# Patient Record
Sex: Female | Born: 1990 | Race: White | Hispanic: No | Marital: Single | Smoking: Never smoker
Health system: Southern US, Community
[De-identification: ages and names within clinical notes are randomized; demographics above are authoritative.]

## PROBLEM LIST (undated history)

## (undated) DIAGNOSIS — N898 Other specified noninflammatory disorders of vagina: Secondary | ICD-10-CM

## (undated) HISTORY — DX: Other specified noninflammatory disorders of vagina: N89.8

---
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
## 2009-09-15 ENCOUNTER — Ambulatory Visit: Payer: Self-pay | Admitting: Unknown Physician Specialty

## 2009-10-03 ENCOUNTER — Ambulatory Visit: Payer: Self-pay | Admitting: Surgery

## 2009-10-06 ENCOUNTER — Ambulatory Visit: Payer: Self-pay | Admitting: Surgery

## 2010-09-23 IMAGING — US ABDOMEN ULTRASOUND
1 series · 17 of 25 positions shown · non-contrast
Comparison: none

REASON FOR EXAM: epigastric abd pain
COMMENTS:

[Series 1: abdomen ultrasound · 17 of 73 slices shown]
[im 1/73]
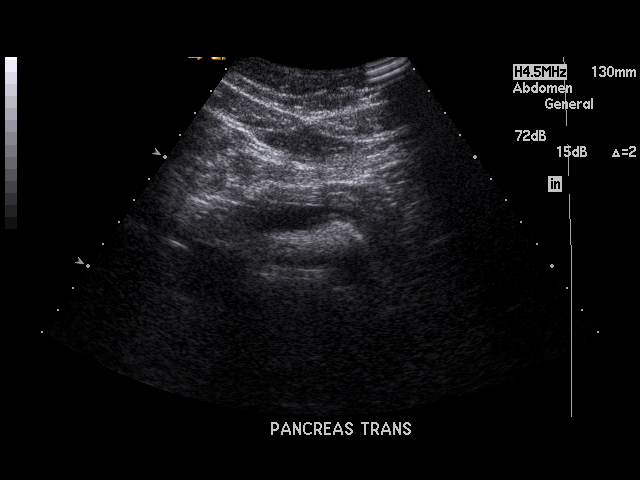
[im 7/73]
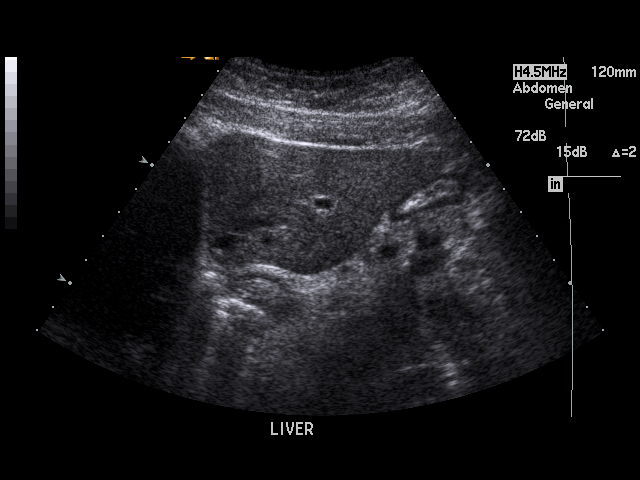
[im 10/73]
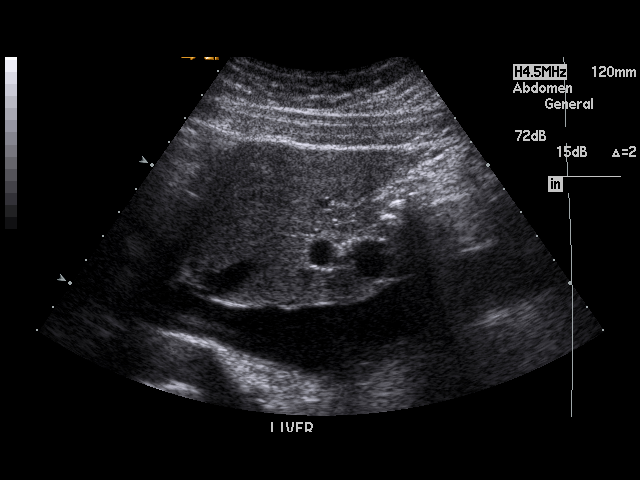
[im 16/73]
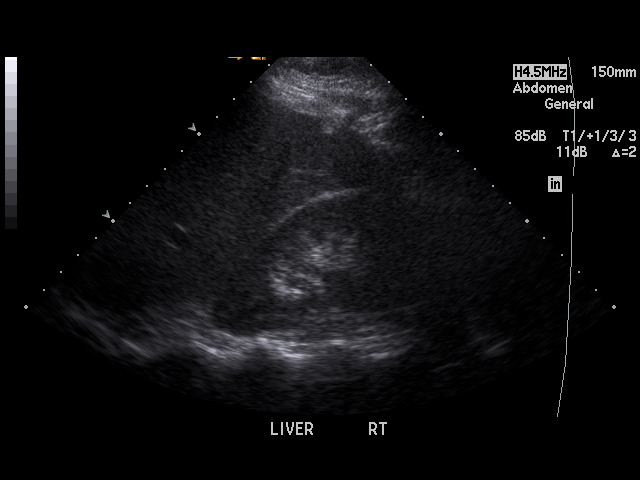
[im 19/73]
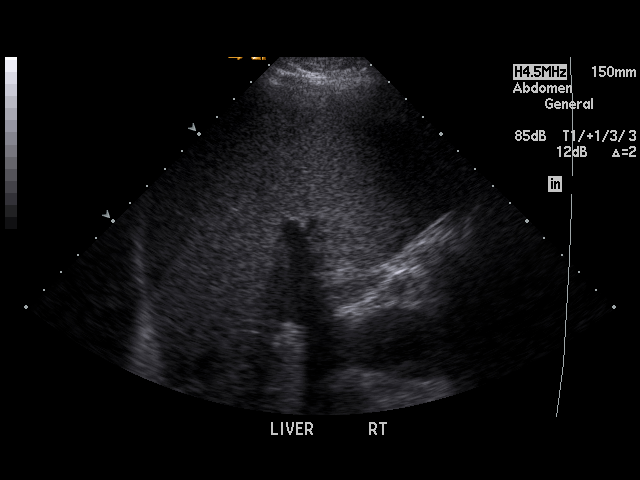
[im 25/73]
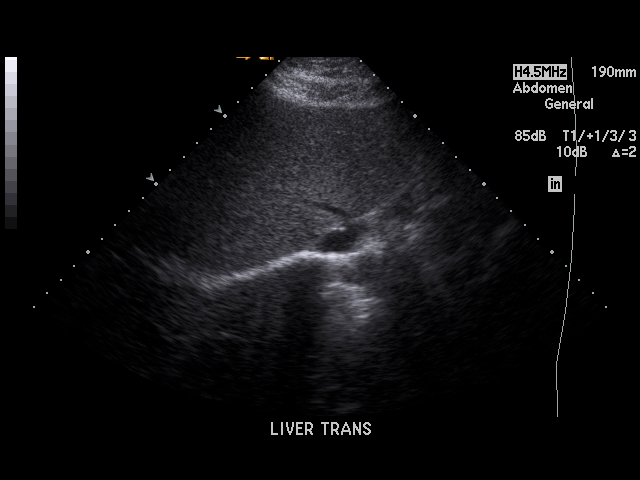
[im 28/73]
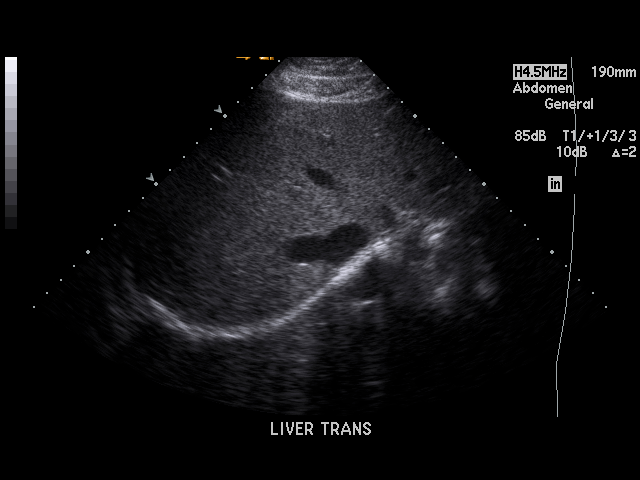
[im 34/73]
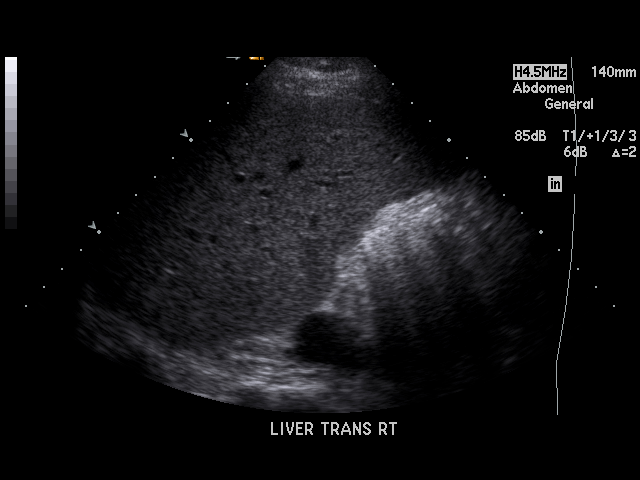
[im 37/73]
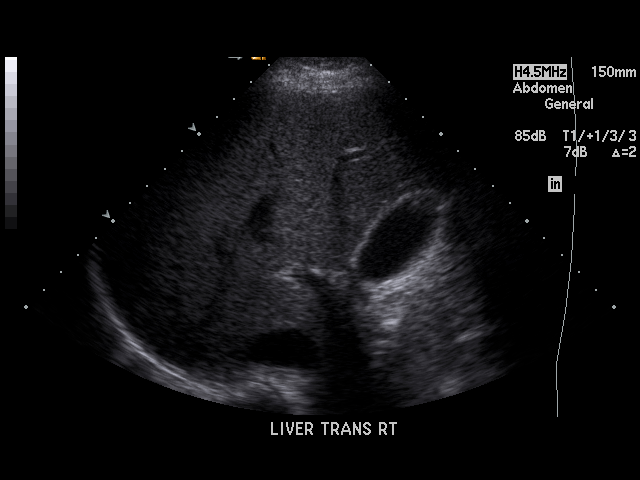
[im 40/73]
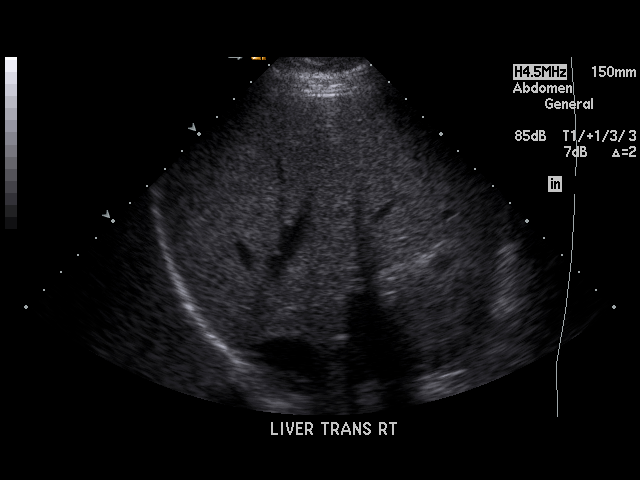
[im 46/73]
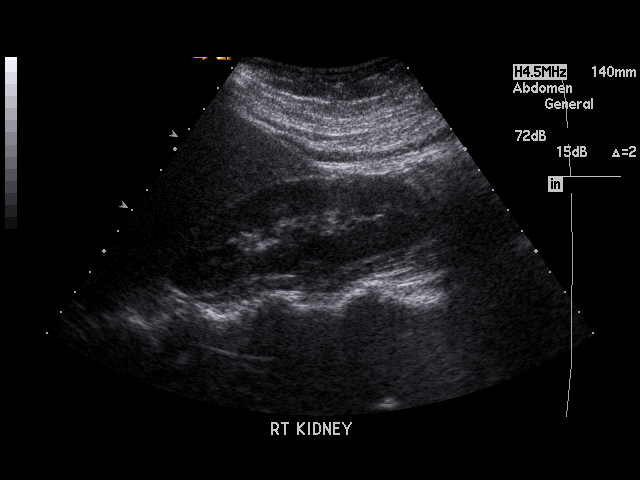
[im 49/73]
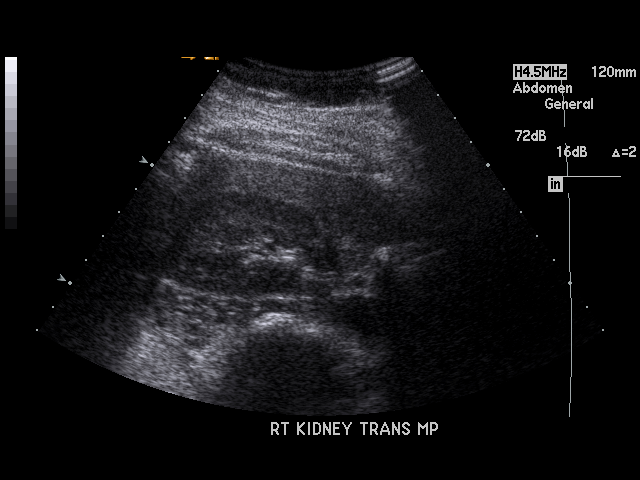
[im 55/73]
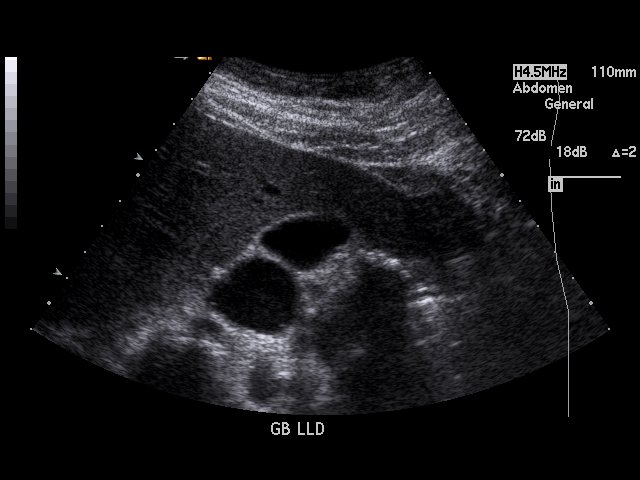
[im 58/73]
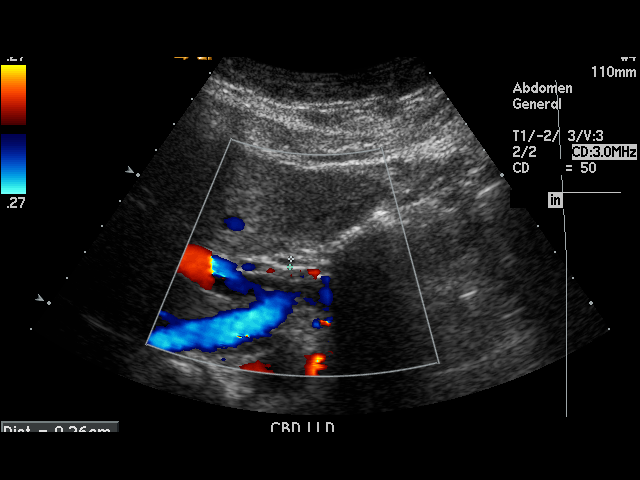
[im 64/73]
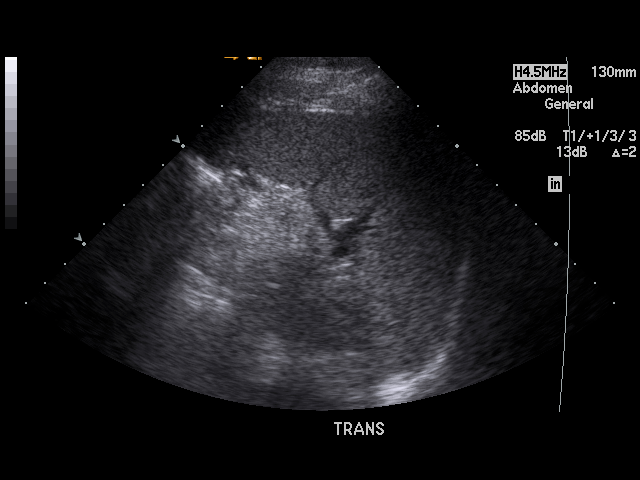
[im 67/73]
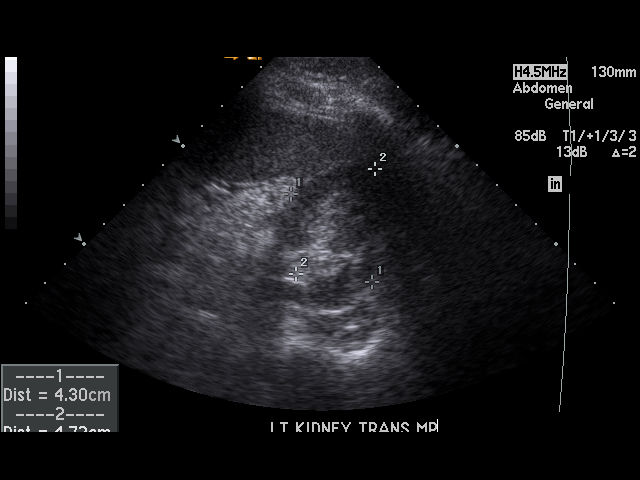
[im 73/73]
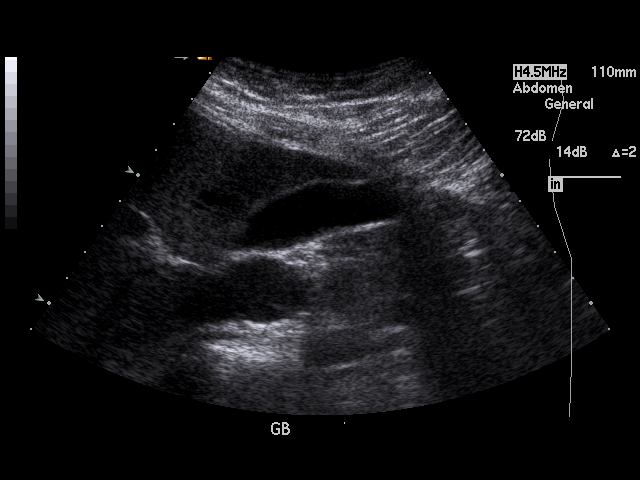

[17 of 25 positions shown; findings below may reference images not displayed]

PROCEDURE:     US  - US ABDOMEN GENERAL SURVEY  - September 15, 2009  [DATE]

RESULT:     The hepatic echo pattern is normal in appearance. No gallstones
are seen. There is no thickening of the gallbladder wall. The common bile
duct measures 2.6 mm in diameter which is within normal limits. The spleen
is prominent but is within the limits of normal for size. The spleen
measures 12.59 cm in AP diameter. The pancreas, abdominal aorta and inferior
vena cava show no significant abnormalities. The kidneys show no
hydronephrosis. There is no ascites.
IMPRESSION: 1. No gallstones or other acute change is identified.
2. The spleen is prominent but within the limits of normal for size.

## 2012-07-23 HISTORY — PX: CERVICAL CONE BIOPSY: SUR198

## 2014-04-13 LAB — LIPID PANEL
Cholesterol: 169 mg/dL (ref 0–200)
HDL: 72 mg/dL — AB (ref 35–70)
LDL CALC: 88 mg/dL
Triglycerides: 44 mg/dL (ref 40–160)

## 2014-04-13 LAB — CBC AND DIFFERENTIAL: Hemoglobin: 14.5 g/dL (ref 12.0–16.0)

## 2014-04-13 LAB — TSH: TSH: 0.8 u[IU]/mL (ref 0.41–5.90)

## 2014-04-13 LAB — BASIC METABOLIC PANEL
BUN: 7 mg/dL (ref 4–21)
Creatinine: 0.9 mg/dL (ref 0.5–1.1)

## 2014-07-02 LAB — HM PAP SMEAR

## 2014-07-14 ENCOUNTER — Ambulatory Visit: Payer: Self-pay | Admitting: Physician Assistant

## 2014-07-14 LAB — RAPID STREP-A WITH REFLX: MICRO TEXT REPORT: NEGATIVE

## 2014-07-17 LAB — BETA STREP CULTURE(ARMC)

## 2014-11-16 DIAGNOSIS — IMO0002 Reserved for concepts with insufficient information to code with codable children: Secondary | ICD-10-CM

## 2014-11-16 DIAGNOSIS — B9689 Other specified bacterial agents as the cause of diseases classified elsewhere: Secondary | ICD-10-CM

## 2014-11-16 DIAGNOSIS — N926 Irregular menstruation, unspecified: Secondary | ICD-10-CM

## 2014-11-16 DIAGNOSIS — N760 Acute vaginitis: Secondary | ICD-10-CM

## 2014-11-16 DIAGNOSIS — A630 Anogenital (venereal) warts: Secondary | ICD-10-CM | POA: Insufficient documentation

## 2015-01-06 ENCOUNTER — Encounter: Payer: Self-pay | Admitting: Obstetrics and Gynecology

## 2015-01-07 ENCOUNTER — Encounter: Payer: Self-pay | Admitting: Obstetrics and Gynecology

## 2015-03-03 ENCOUNTER — Encounter: Payer: Self-pay | Admitting: Obstetrics and Gynecology

## 2015-03-03 ENCOUNTER — Other Ambulatory Visit: Payer: Self-pay | Admitting: Obstetrics and Gynecology

## 2015-03-03 ENCOUNTER — Ambulatory Visit (INDEPENDENT_AMBULATORY_CARE_PROVIDER_SITE_OTHER): Payer: BLUE CROSS/BLUE SHIELD | Admitting: Obstetrics and Gynecology

## 2015-03-03 VITALS — BP 125/82 | HR 90 | Ht 68.0 in | Wt 205.0 lb

## 2015-03-03 DIAGNOSIS — Z01419 Encounter for gynecological examination (general) (routine) without abnormal findings: Secondary | ICD-10-CM | POA: Diagnosis not present

## 2015-03-03 MED ORDER — PODOFILOX 0.5 % EX GEL: Freq: Every evening | CUTANEOUS | Status: DC | PRN

## 2015-03-03 NOTE — Progress Notes (Signed)
  Subjective:     Jill Gordon is a 24 y.o. female and is here for a comprehensive physical exam. The patient reports vulvar itching and possible warts returned.  Social History   Social History  . Marital Status: Single    Spouse Name: N/A  . Number of Children: N/A  . Years of Education: N/A   Occupational History  . Not on file.   Social History Main Topics  . Smoking status: Never Smoker   . Smokeless tobacco: Not on file  . Alcohol Use: Yes  . Drug Use: No  . Sexual Activity: Yes    Birth Control/ Protection: Condom   Other Topics Concern  . Not on file   Social History Narrative   Health Maintenance  Topic Date Due  . HIV Screening  02/11/2005  . PAP SMEAR  02/12/2008  . TETANUS/TDAP  02/11/2009  . INFLUENZA VACCINE  02/21/2015    The following portions of the patient's history were reviewed and updated as appropriate: allergies, current medications, past family history, past medical history, past social history, past surgical history and problem list.  Review of Systems A comprehensive review of systems was negative except for: Genitourinary: positive for genital lesions and clitoral itching   Objective:    General appearance: alert, cooperative, appears stated age and mildly obese Neck: no adenopathy, no carotid bruit, no JVD, supple, symmetrical, trachea midline and thyroid not enlarged, symmetric, no tenderness/mass/nodules Lungs: clear to auscultation bilaterally Breasts: normal appearance, no masses or tenderness Heart: regular rate and rhythm, S1, S2 normal, no murmur, click, rub or gallop Abdomen: soft, non-tender; bowel sounds normal; no masses,  no organomegaly Pelvic: cervix normal in appearance, no adnexal masses or tenderness, no cervical motion tenderness, positive findings: 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 Genital warts and HPV changes; Obesity; H/O abnormal pap      Plan:  Pap obtained; rx for condylox gel- apply qohs and wash off in am, weight loss recommendation made.  RTC 1 year or PRN   See After Visit Summary for Counseling Recommendations

## 2015-03-03 NOTE — Patient Instructions (Addendum)
Place annual gynecologic exam patient instructions here.  Thank you for enrolling in MyChart. Please follow the instructions below to securely access your online medical record. MyChart allows you to send messages to your doctor, view your test results, manage appointments, and more.   How Do I Sign Up? 1. In your Internet browser, go to [employer] and enter [email or web address]. 2. Click on the Sign Up Now link in the Sign In box. You will see the New Member Sign Up page. 3. Enter your MyChart Access Code exactly as it appears below. You will not need to use this code after you've completed the sign-up process. If you do not sign up before the expiration date, you must request a new code.  MyChart Access Code: [email or web address] Expires: 05/02/2015  3:42 PM  4. Enter your Social Security Number ([id number]) and Date of Birth (mm/dd/yyyy) as indicated and click Submit. You will be taken to the next sign-up page. 5. Create a MyChart ID. This will be your MyChart login ID and cannot be changed, so think of one that is secure and easy to remember. 6. Create a MyChart password. You can change your password at any time. 7. Enter your Password Reset Question and Answer. This can be used at a later time if you forget your password.  8. Enter your e-mail address. You will receive e-mail notification when new information is available in MyChart. 9. Click Sign Up. You can now view your medical record.   Additional Information Remember, MyChart is NOT to be used for urgent needs. For medical emergencies, dial 911.   Genital Warts Genital warts are a sexually transmitted infection. They may appear as small bumps on the tissues of the genital area. CAUSES  Genital warts are caused by a virus called human papillomavirus (HPV). HPV is the most common sexually transmitted disease (STD) and infection of the sex organs. This infection is spread by having unprotected sex with an infected  person. It can be spread by vaginal, anal, and oral sex. Many people do not know they are infected. They may be infected for years without problems. However, even if they do not have problems, they can unknowingly pass the infection to their sexual partners. SYMPTOMS   Itching and irritation in the genital area.  Warts that bleed.  Painful sexual intercourse. DIAGNOSIS  Warts are usually recognized with the naked eye on the vagina, vulva, perineum, anus, and rectum. Certain tests can also diagnose genital warts, such as:  A Pap test.  A tissue sample (biopsy) exam.  Colposcopy. A magnifying tool is used to examine the vagina and cervix. The HPV cells will change color when certain solutions are used. TREATMENT  Warts can be removed by:  Applying certain chemicals, such as cantharidin or podophyllin.  Liquid nitrogen freezing (cryotherapy).  Immunotherapy with Candida or Trichophyton injections.  Laser treatment.  Burning with an electrified probe (electrocautery).  Interferon injections.  Surgery. PREVENTION  HPV vaccination can help prevent HPV infections that cause genital warts and that cause cancer of the cervix. It is recommended that the vaccination be given to people between the ages 42 to 24 years old. The vaccine might not work as well or might not work at all if you already have HPV. It should not be given to pregnant women. HOME CARE INSTRUCTIONS   It is important to follow your caregiver's instructions. The warts will not go away without treatment. Repeat treatments are often needed to get rid of warts. Even after  it appears that the warts are gone, the normal tissue underneath often remains infected.  Do not try to treat genital warts with medicine used to treat hand warts. This type of medicine is strong and can burn the skin in the genital area, causing more damage.  Tell your past and current sexual partner(s) that you have genital warts. They may be infected  also and need treatment.  Avoid sexual contact while being treated.  Do not touch or scratch the warts. The infection may spread to other parts of your body.  Women with genital warts should have a cervical cancer check (Pap test) at least once a year. This type of cancer is slow-growing and can be cured if found early. Chances of developing cervical cancer are increased with HPV.  Inform your obstetrician about your warts in the event of pregnancy. This virus can be passed to the baby's respiratory tract. Discuss this with your caregiver.  Use a condom during sexual intercourse. Following treatment, the use of condoms will help prevent reinfection.  Ask your caregiver about using over-the-counter anti-itch creams. SEEK MEDICAL CARE IF:   Your treated skin becomes red, swollen, or painful.  You have a fever.  You feel generally ill.  You feel little lumps in and around your genital area.  You are bleeding or have painful sexual intercourse. MAKE SURE YOU:   Understand these instructions.  Will watch your condition.  Will get help right away if you are not doing well or get worse. Document Released: 07/06/2000 Document Revised: 11/23/2013 Document Reviewed: 01/15/2011 [facility] Patient Information 2015 [place], [place]. This information is not intended to replace advice given to you by your health care provider. Make sure you discuss any questions you have with your health care provider.

## 2015-03-07 LAB — CYTOLOGY - PAP

## 2015-03-08 ENCOUNTER — Encounter: Payer: Self-pay | Admitting: *Deleted

## 2015-05-02 ENCOUNTER — Encounter: Payer: Self-pay | Admitting: Internal Medicine

## 2015-05-02 ENCOUNTER — Ambulatory Visit (INDEPENDENT_AMBULATORY_CARE_PROVIDER_SITE_OTHER): Payer: BLUE CROSS/BLUE SHIELD | Admitting: Internal Medicine

## 2015-05-02 VITALS — BP 100/70 | HR 96 | Temp 98.7°F | Ht 68.0 in | Wt 203.8 lb

## 2015-05-02 DIAGNOSIS — J069 Acute upper respiratory infection, unspecified: Secondary | ICD-10-CM

## 2015-05-02 MED ORDER — FLUTICASONE PROPIONATE 50 MCG/ACT NA SUSP: 2.0000 | Freq: Every day | NASAL | Status: AC

## 2015-05-02 NOTE — Progress Notes (Signed)
Date:  05/02/2015   Name:  Jill Gordon   DOB:  11-02-1990   MRN:  [id number]   Chief Complaint: Sinusitis; Sore Throat; and Cough  patient states onset of sore throat and dry cough 4 days ago. She had some headache on the top of her head as well. Minimal sinus congestion. Nasal drainage is clear. Dry nonproductive cough. No shortness of breath wheezing or fever. She took Aleve which helped her headache. She has not taken any antihistamines or used any Flonase nasal spray. There is no history of smoking. There is no history of chronic sinus infections.   Review of Systems  Constitutional: Negative for fever, chills and fatigue.  HENT: Positive for postnasal drip and sore throat. Negative for ear pain and sinus pressure.   Eyes: Negative for visual disturbance.  Respiratory: Positive for cough. Negative for chest tightness, shortness of breath and wheezing.   Cardiovascular: Negative for chest pain.  Gastrointestinal: Negative for abdominal pain.  Neurological: Positive for headaches. Negative for light-headedness.  Hematological: Negative for adenopathy.    Patient Active Problem List   Diagnosis Date Noted  . Genital warts 11/16/2014  . Painful intercourse 11/16/2014  . Irregular periods/menstrual cycles 11/16/2014  . Bacterial vaginosis 11/16/2014  . 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 Sulfa Antibiotics Rash    Past Surgical History  Procedure Laterality Date  . Tonsillectomy  1996  . Cholecystectomy  2010    Social History  Substance Use Topics  . Smoking status: Never Smoker   . Smokeless tobacco: None  . Alcohol Use: 0.0 oz/week    0 Standard drinks or equivalent per week     Medication list has been reviewed and updated.  Physical Examination:  Physical Exam  Constitutional: She appears well-developed and well-nourished.  HENT:  Right  Ear: Tympanic membrane and ear canal normal.  Left Ear: Tympanic membrane and ear canal normal.  Nose: Nose normal. Right sinus exhibits no maxillary sinus tenderness and no frontal sinus tenderness. Left sinus exhibits no maxillary sinus tenderness and no frontal sinus tenderness.  Mouth/Throat: Posterior oropharyngeal erythema present. No posterior oropharyngeal edema.  Neck: Normal range of motion. Neck supple.  Cardiovascular: Normal rate, regular rhythm and normal heart sounds.   Pulmonary/Chest: Effort normal and breath sounds normal.  Psychiatric: She has a normal mood and affect.  Nursing note reviewed.   BP 100/70 mmHg  Pulse 96  Temp(Src) 98.7 F (37.1 C)  Ht  (1.727 m)  Wt 203 lb 12.8 oz (92.443 kg)  BMI 30.99 kg/m2  SpO2 98%  Assessment and Plan: 1. Viral URI Resume Flonase nasal spray; begin Claritin or Allegra daily for drainage; continue Aleve as needed for sore throat or headache Call for antibiotics if symptoms progress - fluticasone (FLONASE) 50 MCG/ACT nasal spray; Place 2 sprays into both nostrils daily.  Dispense: 16 g; Refill: 6   Bari Edward, MD [facility]  05/02/2015

## 2015-05-02 NOTE — Patient Instructions (Signed)
Begin Flonase NS daily  Begin Allegra 180 mg or Claritin 10 mg once a day  Continue aleve or advil for headache or sore throat

## 2015-05-25 ENCOUNTER — Encounter: Payer: Self-pay | Admitting: Internal Medicine

## 2015-05-26 ENCOUNTER — Telehealth: Payer: Self-pay

## 2015-05-26 ENCOUNTER — Other Ambulatory Visit: Payer: Self-pay | Admitting: Internal Medicine

## 2015-05-26 MED ORDER — AMOXICILLIN-POT CLAVULANATE 875-125 MG PO TABS: 1.0000 | ORAL_TABLET | Freq: Two times a day (BID) | ORAL | Status: DC

## 2015-05-26 NOTE — Telephone Encounter (Signed)
Patient new pharmacy in chart, you said you would send her antibiotic.dr

## 2015-06-06 ENCOUNTER — Encounter: Payer: Self-pay | Admitting: Internal Medicine

## 2015-06-30 ENCOUNTER — Encounter: Payer: Self-pay | Admitting: Internal Medicine

## 2016-03-07 ENCOUNTER — Encounter: Payer: Self-pay | Admitting: Obstetrics and Gynecology

## 2016-03-07 ENCOUNTER — Ambulatory Visit (INDEPENDENT_AMBULATORY_CARE_PROVIDER_SITE_OTHER): Payer: Commercial Managed Care - PPO | Admitting: Obstetrics and Gynecology

## 2016-03-07 VITALS — BP 134/86 | HR 88 | Ht 68.0 in | Wt 205.2 lb

## 2016-03-07 DIAGNOSIS — Z01419 Encounter for gynecological examination (general) (routine) without abnormal findings: Secondary | ICD-10-CM | POA: Diagnosis not present

## 2016-03-07 DIAGNOSIS — A630 Anogenital (venereal) warts: Secondary | ICD-10-CM | POA: Diagnosis not present

## 2016-03-07 DIAGNOSIS — L0293 Carbuncle, unspecified: Secondary | ICD-10-CM | POA: Diagnosis not present

## 2016-03-07 MED ORDER — AMOXICILLIN-POT CLAVULANATE 250-62.5 MG/5ML PO SUSR: 500.0000 mg | Freq: Two times a day (BID) | ORAL | 2 refills | Status: AC

## 2016-03-07 MED ORDER — PODOFILOX 0.5 % EX GEL: Freq: Two times a day (BID) | CUTANEOUS | 4 refills | Status: DC

## 2016-03-07 NOTE — Patient Instructions (Signed)
Preventive Care for Adults, Female A healthy lifestyle and preventive care can promote health and wellness. Preventive health guidelines for women include the following key practices.  A routine yearly physical is a good way to check with your health care provider about your health and preventive screening. It is a chance to share any concerns and updates on your health and to receive a thorough exam.  Visit your dentist for a routine exam and preventive care every 6 months. Brush your teeth twice a day and floss once a day. Good oral hygiene prevents tooth decay and gum disease.  The frequency of eye exams is based on your age, health, family medical history, use of contact lenses, and other factors. Follow your health care provider's recommendations for frequency of eye exams.  Eat a healthy diet. Foods like vegetables, fruits, whole grains, low-fat dairy products, and lean protein foods contain the nutrients you need without too many calories. Decrease your intake of foods high in solid fats, added sugars, and salt. Eat the right amount of calories for you.Get information about a proper diet from your health care provider, if necessary.  Regular physical exercise is one of the most important things you can do for your health. Most adults should get at least 150 minutes of moderate-intensity exercise (any activity that increases your heart rate and causes you to sweat) each week. In addition, most adults need muscle-strengthening exercises on 2 or more days a week.  Maintain a healthy weight. The body mass index (BMI) is a screening tool to identify possible weight problems. It provides an estimate of body fat based on height and weight. Your health care provider can find your BMI and can help you achieve or maintain a healthy weight.For adults 20 years and older:  A BMI below 18.5 is considered underweight.  A BMI of 18.5 to 24.9 is normal.  A BMI of 25 to 29.9 is considered  overweight.  A BMI of 30 and above is considered obese.  Maintain normal blood lipids and cholesterol levels by exercising and minimizing your intake of saturated fat. Eat a balanced diet with plenty of fruit and vegetables. Blood tests for lipids and cholesterol should begin at age 64 and be repeated every 5 years. If your lipid or cholesterol levels are high, you are over 50, or you are at high risk for heart disease, you may need your cholesterol levels checked more frequently.Ongoing high lipid and cholesterol levels should be treated with medicines if diet and exercise are not working.  If you smoke, find out from your health care provider how to quit. If you do not use tobacco, do not start.  Lung cancer screening is recommended for adults aged 52-80 years who are at high risk for developing lung cancer because of a history of smoking. A yearly low-dose CT scan of the lungs is recommended for people who have at least a 30-pack-year history of smoking and are a current smoker or have quit within the past 15 years. A pack year of smoking is smoking an average of 1 pack of cigarettes a day for 1 year (for example: 1 pack a day for 30 years or 2 packs a day for 15 years). Yearly screening should continue until the smoker has stopped smoking for at least 15 years. Yearly screening should be stopped for people who develop a health problem that would prevent them from having lung cancer treatment.  If you are pregnant, do not drink alcohol. If you are  breastfeeding, be very cautious about drinking alcohol. If you are not pregnant and choose to drink alcohol, do not have more than 1 drink per day. One drink is considered to be 12 ounces (355 mL) of beer, 5 ounces (148 mL) of wine, or 1.5 ounces (44 mL) of liquor.  Avoid use of street drugs. Do not share needles with anyone. Ask for help if you need support or instructions about stopping the use of drugs.  High blood pressure causes heart disease and  increases the risk of stroke. Your blood pressure should be checked at least every 1 to 2 years. Ongoing high blood pressure should be treated with medicines if weight loss and exercise do not work.  If you are 25-78 years old, ask your health care provider if you should take aspirin to prevent strokes.  Diabetes screening is done by taking a blood sample to check your blood glucose level after you have not eaten for a certain period of time (fasting). If you are not overweight and you do not have risk factors for diabetes, you should be screened once every 3 years starting at age 86. If you are overweight or obese and you are 3-87 years of age, you should be screened for diabetes every year as part of your cardiovascular risk assessment.  Breast cancer screening is essential preventive care for women. You should practice "breast self-awareness." This means understanding the normal appearance and feel of your breasts and may include breast self-examination. Any changes detected, no matter how small, should be reported to a health care provider. Women in their 66s and 30s should have a clinical breast exam (CBE) by a health care provider as part of a regular health exam every 1 to 3 years. After age 43, women should have a CBE every year. Starting at age 37, women should consider having a mammogram (breast X-ray test) every year. Women who have a family history of breast cancer should talk to their health care provider about genetic screening. Women at a high risk of breast cancer should talk to their health care providers about having an MRI and a mammogram every year.  Breast cancer gene (BRCA)-related cancer risk assessment is recommended for women who have family members with BRCA-related cancers. BRCA-related cancers include breast, ovarian, tubal, and peritoneal cancers. Having family members with these cancers may be associated with an increased risk for harmful changes (mutations) in the breast  cancer genes BRCA1 and BRCA2. Results of the assessment will determine the need for genetic counseling and BRCA1 and BRCA2 testing.  Your health care provider may recommend that you be screened regularly for cancer of the pelvic organs (ovaries, uterus, and vagina). This screening involves a pelvic examination, including checking for microscopic changes to the surface of your cervix (Pap test). You may be encouraged to have this screening done every 3 years, beginning at age 78.  For women ages 79-65, health care providers may recommend pelvic exams and Pap testing every 3 years, or they may recommend the Pap and pelvic exam, combined with testing for human papilloma virus (HPV), every 5 years. Some types of HPV increase your risk of cervical cancer. Testing for HPV may also be done on women of any age with unclear Pap test results.  Other health care providers may not recommend any screening for nonpregnant women who are considered low risk for pelvic cancer and who do not have symptoms. Ask your health care provider if a screening pelvic exam is right for  you.  If you have had past treatment for cervical cancer or a condition that could lead to cancer, you need Pap tests and screening for cancer for at least 20 years after your treatment. If Pap tests have been discontinued, your risk factors (such as having a new sexual partner) need to be reassessed to determine if screening should resume. Some women have medical problems that increase the chance of getting cervical cancer. In these cases, your health care provider may recommend more frequent screening and Pap tests.  Colorectal cancer can be detected and often prevented. Most routine colorectal cancer screening begins at the age of 50 years and continues through age 75 years. However, your health care provider may recommend screening at an earlier age if you have risk factors for colon cancer. On a yearly basis, your health care provider may provide  home test kits to check for hidden blood in the stool. Use of a small camera at the end of a tube, to directly examine the colon (sigmoidoscopy or colonoscopy), can detect the earliest forms of colorectal cancer. Talk to your health care provider about this at age 50, when routine screening begins. Direct exam of the colon should be repeated every 5-10 years through age 75 years, unless early forms of precancerous polyps or small growths are found.  People who are at an increased risk for hepatitis B should be screened for this virus. You are considered at high risk for hepatitis B if:  You were born in a country where hepatitis B occurs often. Talk with your health care provider about which countries are considered high risk.  Your parents were born in a high-risk country and you have not received a shot to protect against hepatitis B (hepatitis B vaccine).  You have HIV or AIDS.  You use needles to inject street drugs.  You live with, or have sex with, someone who has hepatitis B.  You get hemodialysis treatment.  You take certain medicines for conditions like cancer, organ transplantation, and autoimmune conditions.  Hepatitis C blood testing is recommended for all people born from 1945 through 1965 and any individual with known risks for hepatitis C.  Practice safe sex. Use condoms and avoid high-risk sexual practices to reduce the spread of sexually transmitted infections (STIs). STIs include gonorrhea, chlamydia, syphilis, trichomonas, herpes, HPV, and human immunodeficiency virus (HIV). Herpes, HIV, and HPV are viral illnesses that have no cure. They can result in disability, cancer, and death.  You should be screened for sexually transmitted illnesses (STIs) including gonorrhea and chlamydia if:  You are sexually active and are younger than 24 years.  You are older than 24 years and your health care provider tells you that you are at risk for this type of infection.  Your sexual  activity has changed since you were last screened and you are at an increased risk for chlamydia or gonorrhea. Ask your health care provider if you are at risk.  If you are at risk of being infected with HIV, it is recommended that you take a prescription medicine daily to prevent HIV infection. This is called preexposure prophylaxis (PrEP). You are considered at risk if:  You are sexually active and do not regularly use condoms or know the HIV status of your partner(s).  You take drugs by injection.  You are sexually active with a partner who has HIV.  Talk with your health care provider about whether you are at high risk of being infected with HIV. If   you choose to begin PrEP, you should first be tested for HIV. You should then be tested every 3 months for as long as you are taking PrEP.  Osteoporosis is a disease in which the bones lose minerals and strength with aging. This can result in serious bone fractures or breaks. The risk of osteoporosis can be identified using a bone density scan. Women ages 1 years and over and women at risk for fractures or osteoporosis should discuss screening with their health care providers. Ask your health care provider whether you should take a calcium supplement or vitamin D to reduce the rate of osteoporosis.  Menopause can be associated with physical symptoms and risks. Hormone replacement therapy is available to decrease symptoms and risks. You should talk to your health care provider about whether hormone replacement therapy is right for you.  Use sunscreen. Apply sunscreen liberally and repeatedly throughout the day. You should seek shade when your shadow is shorter than you. Protect yourself by wearing long sleeves, pants, a wide-brimmed hat, and sunglasses year round, whenever you are outdoors.  Once a month, do a whole body skin exam, using a mirror to look at the skin on your back. Tell your health care provider of new moles, moles that have irregular  borders, moles that are larger than a pencil eraser, or moles that have changed in shape or color.  Stay current with required vaccines (immunizations).  Influenza vaccine. All adults should be immunized every year.  Tetanus, diphtheria, and acellular pertussis (Td, Tdap) vaccine. Pregnant women should receive 1 dose of Tdap vaccine during each pregnancy. The dose should be obtained regardless of the length of time since the last dose. Immunization is preferred during the 27th-36th week of gestation. An adult who has not previously received Tdap or who does not know her vaccine status should receive 1 dose of Tdap. This initial dose should be followed by tetanus and diphtheria toxoids (Td) booster doses every 10 years. Adults with an unknown or incomplete history of completing a 3-dose immunization series with Td-containing vaccines should begin or complete a primary immunization series including a Tdap dose. Adults should receive a Td booster every 10 years.  Varicella vaccine. An adult without evidence of immunity to varicella should receive 2 doses or a second dose if she has previously received 1 dose. Pregnant females who do not have evidence of immunity should receive the first dose after pregnancy. This first dose should be obtained before leaving the health care facility. The second dose should be obtained 4-8 weeks after the first dose.  Human papillomavirus (HPV) vaccine. Females aged 13-26 years who have not received the vaccine previously should obtain the 3-dose series. The vaccine is not recommended for use in pregnant females. However, pregnancy testing is not needed before receiving a dose. If a female is found to be pregnant after receiving a dose, no treatment is needed. In that case, the remaining doses should be delayed until after the pregnancy. Immunization is recommended for any person with an immunocompromised condition through the age of 24 years if she did not get any or all doses  earlier. During the 3-dose series, the second dose should be obtained 4-8 weeks after the first dose. The third dose should be obtained 24 weeks after the first dose and 16 weeks after the second dose.  Zoster vaccine. One dose is recommended for adults aged 97 years or older unless certain conditions are present.  Measles, mumps, and rubella (MMR) vaccine. Adults born  before 1957 generally are considered immune to measles and mumps. Adults born in [birth year] or later should have 1 or more doses of MMR vaccine unless there is a contraindication to the vaccine or there is laboratory evidence of immunity to each of the three diseases. A routine second dose of MMR vaccine should be obtained at least 28 days after the first dose for students attending postsecondary schools, health care workers, or international travelers. People who received inactivated measles vaccine or an unknown type of measles vaccine during 1963-1967 should receive 2 doses of MMR vaccine. People who received inactivated mumps vaccine or an unknown type of mumps vaccine before 1979 and are at high risk for mumps infection should consider immunization with 2 doses of MMR vaccine. For females of childbearing age, rubella immunity should be determined. If there is no evidence of immunity, females who are not pregnant should be vaccinated. If there is no evidence of immunity, females who are pregnant should delay immunization until after pregnancy. Unvaccinated health care workers born before 60 who lack laboratory evidence of measles, mumps, or rubella immunity or laboratory confirmation of disease should consider measles and mumps immunization with 2 doses of MMR vaccine or rubella immunization with 1 dose of MMR vaccine.  Pneumococcal 13-valent conjugate (PCV13) vaccine. When indicated, a person who is uncertain of his immunization history and has no record of immunization should receive the PCV13 vaccine. All adults 61 years of age and older  should receive this vaccine. An adult aged 92 years or older who has certain medical conditions and has not been previously immunized should receive 1 dose of PCV13 vaccine. This PCV13 should be followed with a dose of pneumococcal polysaccharide (PPSV23) vaccine. Adults who are at high risk for pneumococcal disease should obtain the PPSV23 vaccine at least 8 weeks after the dose of PCV13 vaccine. Adults older than 26 years of age who have normal immune system function should obtain the PPSV23 vaccine dose at least 1 year after the dose of PCV13 vaccine.  Pneumococcal polysaccharide (PPSV23) vaccine. When PCV13 is also indicated, PCV13 should be obtained first. All adults aged 2 years and older should be immunized. An adult younger than age 30 years who has certain medical conditions should be immunized. Any person who resides in a nursing home or long-term care facility should be immunized. An adult smoker should be immunized. People with an immunocompromised condition and certain other conditions should receive both PCV13 and PPSV23 vaccines. People with human immunodeficiency virus (HIV) infection should be immunized as soon as possible after diagnosis. Immunization during chemotherapy or radiation therapy should be avoided. Routine use of PPSV23 vaccine is not recommended for American Indians, [place] Natives, or people younger than 65 years unless there are medical conditions that require PPSV23 vaccine. When indicated, people who have unknown immunization and have no record of immunization should receive PPSV23 vaccine. One-time revaccination 5 years after the first dose of PPSV23 is recommended for people aged 19-64 years who have chronic kidney failure, nephrotic syndrome, asplenia, or immunocompromised conditions. People who received 1-2 doses of PPSV23 before age 44 years should receive another dose of PPSV23 vaccine at age 83 years or later if at least 5 years have passed since the previous dose. Doses  of PPSV23 are not needed for people immunized with PPSV23 at or after age 20 years.  Meningococcal vaccine. Adults with asplenia or persistent complement component deficiencies should receive 2 doses of quadrivalent meningococcal conjugate (MenACWY-D) vaccine. The doses should be obtained  at least 2 months apart. Microbiologists working with certain meningococcal bacteria, [place] recruits, people at risk during an outbreak, and people who travel to or live in countries with a high rate of meningitis should be immunized. A first-year college student up through age 28 years who is living in a residence hall should receive a dose if she did not receive a dose on or after her 16th birthday. Adults who have certain high-risk conditions should receive one or more doses of vaccine.  Hepatitis A vaccine. Adults who wish to be protected from this disease, have certain high-risk conditions, work with hepatitis A-infected animals, work in hepatitis A research labs, or travel to or work in countries with a high rate of hepatitis A should be immunized. Adults who were previously unvaccinated and who anticipate close contact with an international adoptee during the first 60 days after arrival in the [place] from a country with a high rate of hepatitis A should be immunized.  Hepatitis B vaccine. Adults who wish to be protected from this disease, have certain high-risk conditions, may be exposed to blood or other infectious body fluids, are household contacts or sex partners of hepatitis B positive people, are clients or workers in certain care facilities, or travel to or work in countries with a high rate of hepatitis B should be immunized.  Haemophilus influenzae type b (Hib) vaccine. A previously unvaccinated person with asplenia or sickle cell disease or having a scheduled splenectomy should receive 1 dose of Hib vaccine. Regardless of previous immunization, a recipient of a hematopoietic stem cell transplant  should receive a 3-dose series 6-12 months after her successful transplant. Hib vaccine is not recommended for adults with HIV infection. Preventive Services / Frequency Ages 71 to 87 years  Blood pressure check.** / Every 3-5 years.  Lipid and cholesterol check.** / Every 5 years beginning at age 1.  Clinical breast exam.** / Every 3 years for women in their 3s and 31s.  BRCA-related cancer risk assessment.** / For women who have family members with a BRCA-related cancer (breast, ovarian, tubal, or peritoneal cancers).  Pap test.** / Every 2 years from ages 50 through 86. Every 3 years starting at age 87 through age 7 or 75 with a history of 3 consecutive normal Pap tests.  HPV screening.** / Every 3 years from ages 59 through ages 35 to 6 with a history of 3 consecutive normal Pap tests.  Hepatitis C blood test.** / For any individual with known risks for hepatitis C.  Skin self-exam. / Monthly.  Influenza vaccine. / Every year.  Tetanus, diphtheria, and acellular pertussis (Tdap, Td) vaccine.** / Consult your health care provider. Pregnant women should receive 1 dose of Tdap vaccine during each pregnancy. 1 dose of Td every 10 years.  Varicella vaccine.** / Consult your health care provider. Pregnant females who do not have evidence of immunity should receive the first dose after pregnancy.  HPV vaccine. / 3 doses over 6 months, if 72 and younger. The vaccine is not recommended for use in pregnant females. However, pregnancy testing is not needed before receiving a dose.  Measles, mumps, rubella (MMR) vaccine.** / You need at least 1 dose of MMR if you were born in 1957 or later. You may also need a 2nd dose. For females of childbearing age, rubella immunity should be determined. If there is no evidence of immunity, females who are not pregnant should be vaccinated. If there is no evidence of immunity, females who are  pregnant should delay immunization until after  pregnancy.  Pneumococcal 13-valent conjugate (PCV13) vaccine.** / Consult your health care provider.  Pneumococcal polysaccharide (PPSV23) vaccine.** / 1 to 2 doses if you smoke cigarettes or if you have certain conditions.  Meningococcal vaccine.** / 1 dose if you are age 87 to 44 years and a Market researcher living in a residence hall, or have one of several medical conditions, you need to get vaccinated against meningococcal disease. You may also need additional booster doses.  Hepatitis A vaccine.** / Consult your health care provider.  Hepatitis B vaccine.** / Consult your health care provider.  Haemophilus influenzae type b (Hib) vaccine.** / Consult your health care provider. Ages 86 to 38 years  Blood pressure check.** / Every year.  Lipid and cholesterol check.** / Every 5 years beginning at age 49 years.  Lung cancer screening. / Every year if you are aged 71-80 years and have a 30-pack-year history of smoking and currently smoke or have quit within the past 15 years. Yearly screening is stopped once you have quit smoking for at least 15 years or develop a health problem that would prevent you from having lung cancer treatment.  Clinical breast exam.** / Every year after age 51 years.  BRCA-related cancer risk assessment.** / For women who have family members with a BRCA-related cancer (breast, ovarian, tubal, or peritoneal cancers).  Mammogram.** / Every year beginning at age 18 years and continuing for as long as you are in good health. Consult with your health care provider.  Pap test.** / Every 3 years starting at age 63 years through age 37 or 57 years with a history of 3 consecutive normal Pap tests.  HPV screening.** / Every 3 years from ages 41 years through ages 76 to 23 years with a history of 3 consecutive normal Pap tests.  Fecal occult blood test (FOBT) of stool. / Every year beginning at age 36 years and continuing until age 51 years. You may not need  to do this test if you get a colonoscopy every 10 years.  Flexible sigmoidoscopy or colonoscopy.** / Every 5 years for a flexible sigmoidoscopy or every 10 years for a colonoscopy beginning at age 36 years and continuing until age 35 years.  Hepatitis C blood test.** / For all people born from [birth year] through 1966 and any individual with known risks for hepatitis C.  Skin self-exam. / Monthly.  Influenza vaccine. / Every year.  Tetanus, diphtheria, and acellular pertussis (Tdap/Td) vaccine.** / Consult your health care provider. Pregnant women should receive 1 dose of Tdap vaccine during each pregnancy. 1 dose of Td every 10 years.  Varicella vaccine.** / Consult your health care provider. Pregnant females who do not have evidence of immunity should receive the first dose after pregnancy.  Zoster vaccine.** / 1 dose for adults aged 73 years or older.  Measles, mumps, rubella (MMR) vaccine.** / You need at least 1 dose of MMR if you were born in 1957 or later. You may also need a second dose. For females of childbearing age, rubella immunity should be determined. If there is no evidence of immunity, females who are not pregnant should be vaccinated. If there is no evidence of immunity, females who are pregnant should delay immunization until after pregnancy.  Pneumococcal 13-valent conjugate (PCV13) vaccine.** / Consult your health care provider.  Pneumococcal polysaccharide (PPSV23) vaccine.** / 1 to 2 doses if you smoke cigarettes or if you have certain conditions.  Meningococcal vaccine.** /  Consult your health care provider.  Hepatitis A vaccine.** / Consult your health care provider.  Hepatitis B vaccine.** / Consult your health care provider.  Haemophilus influenzae type b (Hib) vaccine.** / Consult your health care provider. Ages 80 years and over  Blood pressure check.** / Every year.  Lipid and cholesterol check.** / Every 5 years beginning at age 62 years.  Lung cancer  screening. / Every year if you are aged 32-80 years and have a 30-pack-year history of smoking and currently smoke or have quit within the past 15 years. Yearly screening is stopped once you have quit smoking for at least 15 years or develop a health problem that would prevent you from having lung cancer treatment.  Clinical breast exam.** / Every year after age 61 years.  BRCA-related cancer risk assessment.** / For women who have family members with a BRCA-related cancer (breast, ovarian, tubal, or peritoneal cancers).  Mammogram.** / Every year beginning at age 39 years and continuing for as long as you are in good health. Consult with your health care provider.  Pap test.** / Every 3 years starting at age 85 years through age 74 or 72 years with 3 consecutive normal Pap tests. Testing can be stopped between 65 and 70 years with 3 consecutive normal Pap tests and no abnormal Pap or HPV tests in the past 10 years.  HPV screening.** / Every 3 years from ages 55 years through ages 67 or 77 years with a history of 3 consecutive normal Pap tests. Testing can be stopped between 65 and 70 years with 3 consecutive normal Pap tests and no abnormal Pap or HPV tests in the past 10 years.  Fecal occult blood test (FOBT) of stool. / Every year beginning at age 81 years and continuing until age 22 years. You may not need to do this test if you get a colonoscopy every 10 years.  Flexible sigmoidoscopy or colonoscopy.** / Every 5 years for a flexible sigmoidoscopy or every 10 years for a colonoscopy beginning at age 67 years and continuing until age 22 years.  Hepatitis C blood test.** / For all people born from [birth year] through 1966 and any individual with known risks for hepatitis C.  Osteoporosis screening.** / A one-time screening for women ages 8 years and over and women at risk for fractures or osteoporosis.  Skin self-exam. / Monthly.  Influenza vaccine. / Every year.  Tetanus, diphtheria, and  acellular pertussis (Tdap/Td) vaccine.** / 1 dose of Td every 10 years.  Varicella vaccine.** / Consult your health care provider.  Zoster vaccine.** / 1 dose for adults aged 56 years or older.  Pneumococcal 13-valent conjugate (PCV13) vaccine.** / Consult your health care provider.  Pneumococcal polysaccharide (PPSV23) vaccine.** / 1 dose for all adults aged 15 years and older.  Meningococcal vaccine.** / Consult your health care provider.  Hepatitis A vaccine.** / Consult your health care provider.  Hepatitis B vaccine.** / Consult your health care provider.  Haemophilus influenzae type b (Hib) vaccine.** / Consult your health care provider. ** Family history and personal history of risk and conditions may change your health care provider's recommendations.   This information is not intended to replace advice given to you by your health care provider. Make sure you discuss any questions you have with your health care provider.   Document Released: 09/04/2001 Document Revised: 07/30/2014 Document Reviewed: 12/04/2010 Elsevier Interactive Patient Education [employer].

## 2016-03-07 NOTE — Progress Notes (Signed)
   Subjective:     Jill Gordon Jill Gordon is a 25 y.o. female and is here for a comprehensive physical exam. The patient reports problems - recurring bumps in groin and warts reappeared on vaginal opening..  Social History   Social History  . Marital status: Single    Spouse name: N/A  . Number of children: N/A  . Years of education: N/A   Occupational History  . Not on file.   Social History Main Topics  . Smoking status: Never Smoker  . Smokeless tobacco: Never Used  . Alcohol use 0.0 oz/week  . Drug use: No  . Sexual activity: Yes    Birth control/ protection: Pill   Other Topics Concern  . Not on file   Social History Narrative  . No narrative on file   Health Maintenance  Topic Date Due  . HIV Screening  02/11/2005  . TETANUS/TDAP  02/11/2009  . INFLUENZA VACCINE  02/21/2016  . PAP SMEAR  03/02/2018    The following portions of the patient's history were reviewed and updated as appropriate: allergies, current medications, past family history, past medical history, past social history, past surgical history and problem list.  Review of Systems Pertinent items noted in HPI and remainder of comprehensive ROS otherwise negative.   Objective:    General appearance: alert, cooperative and appears stated age Neck: no adenopathy, no carotid bruit, no JVD, supple, symmetrical, trachea midline and thyroid not enlarged, symmetric, no tenderness/mass/nodules Lungs: clear to auscultation bilaterally Breasts: normal appearance, no masses or tenderness Heart: regular rate and rhythm, S1, S2 normal, no murmur, click, rub or gallop Abdomen: soft, non-tender; bowel sounds normal; no masses,  no organomegaly Pelvic: cervix normal in appearance, external genitalia normal, no adnexal masses or tenderness, no cervical motion tenderness, positive findings: 2 warts noted-one on right labia and middle mons pubis, rectovaginal septum normal, uterus normal size, shape, and consistency and  vagina normal without discharge   Multiple boils in different stages of healing in both groin.   Assessment:    Healthy female exam. genital warts; boils;     Plan:  TCA 80% applied to current warts, refilled condylox gel for home use; epsom salt applications as needed.  RTC as needed.   See After Visit Summary for Counseling Recommendations

## 2016-03-09 ENCOUNTER — Encounter: Payer: BLUE CROSS/BLUE SHIELD | Admitting: Internal Medicine

## 2016-10-08 ENCOUNTER — Encounter: Payer: Self-pay | Admitting: Obstetrics and Gynecology

## 2016-11-19 ENCOUNTER — Encounter: Payer: Self-pay | Admitting: Obstetrics and Gynecology

## 2016-11-21 ENCOUNTER — Other Ambulatory Visit: Payer: Self-pay | Admitting: Obstetrics and Gynecology

## 2016-11-21 MED ORDER — PODOFILOX 0.5 % EX GEL: Freq: Two times a day (BID) | CUTANEOUS | 4 refills | Status: AC

## 2016-11-22 ENCOUNTER — Telehealth: Payer: Self-pay | Admitting: Obstetrics and Gynecology

## 2016-11-22 NOTE — Telephone Encounter (Signed)
Faxed again-ac

## 2016-11-22 NOTE — Telephone Encounter (Signed)
Please send script from yesterday to [facility] - [address]  The number is [phone] She called to verify and they don't have it

## 2017-03-14 ENCOUNTER — Encounter: Payer: Commercial Managed Care - PPO | Admitting: Obstetrics and Gynecology
